# Patient Record
Sex: Male | Born: 1965 | Race: White | Hispanic: No | Marital: Married
Health system: Southern US, Community
[De-identification: ages and names within clinical notes are randomized; demographics above are authoritative.]

---

## 1998-03-14 ENCOUNTER — Ambulatory Visit (HOSPITAL_COMMUNITY): Admission: RE | Admit: 1998-03-14 | Discharge: 1998-03-14 | Payer: Self-pay | Admitting: Family Medicine

## 1998-04-26 ENCOUNTER — Ambulatory Visit (HOSPITAL_BASED_OUTPATIENT_CLINIC_OR_DEPARTMENT_OTHER): Admission: RE | Admit: 1998-04-26 | Discharge: 1998-04-26 | Payer: Self-pay | Admitting: Orthopedic Surgery

## 1998-08-29 ENCOUNTER — Ambulatory Visit (HOSPITAL_COMMUNITY): Admission: RE | Admit: 1998-08-29 | Discharge: 1998-08-29 | Payer: Self-pay | Admitting: Gastroenterology

## 2000-12-04 ENCOUNTER — Encounter: Payer: Self-pay | Admitting: Emergency Medicine

## 2000-12-04 ENCOUNTER — Emergency Department (HOSPITAL_COMMUNITY): Admission: AC | Admit: 2000-12-04 | Discharge: 2000-12-04 | Payer: Self-pay

## 2004-11-10 ENCOUNTER — Inpatient Hospital Stay (HOSPITAL_COMMUNITY): Admission: EM | Admit: 2004-11-10 | Discharge: 2004-11-16 | Payer: Self-pay | Admitting: Emergency Medicine

## 2004-11-10 ENCOUNTER — Ambulatory Visit: Payer: Self-pay | Admitting: Infectious Diseases

## 2006-12-23 IMAGING — CR DG HAND COMPLETE 3+V*R*
3 series · 3 of 3 positions shown · non-contrast
Comparison: none

CLINICAL DATA: 39-year-old male ? right fifth finger swelling which has progressed across the hand.  Possible cellulitis.  No known injury.  
RIGHT HAND ? 3 VIEWS:

[view not recorded (1 of 3)]
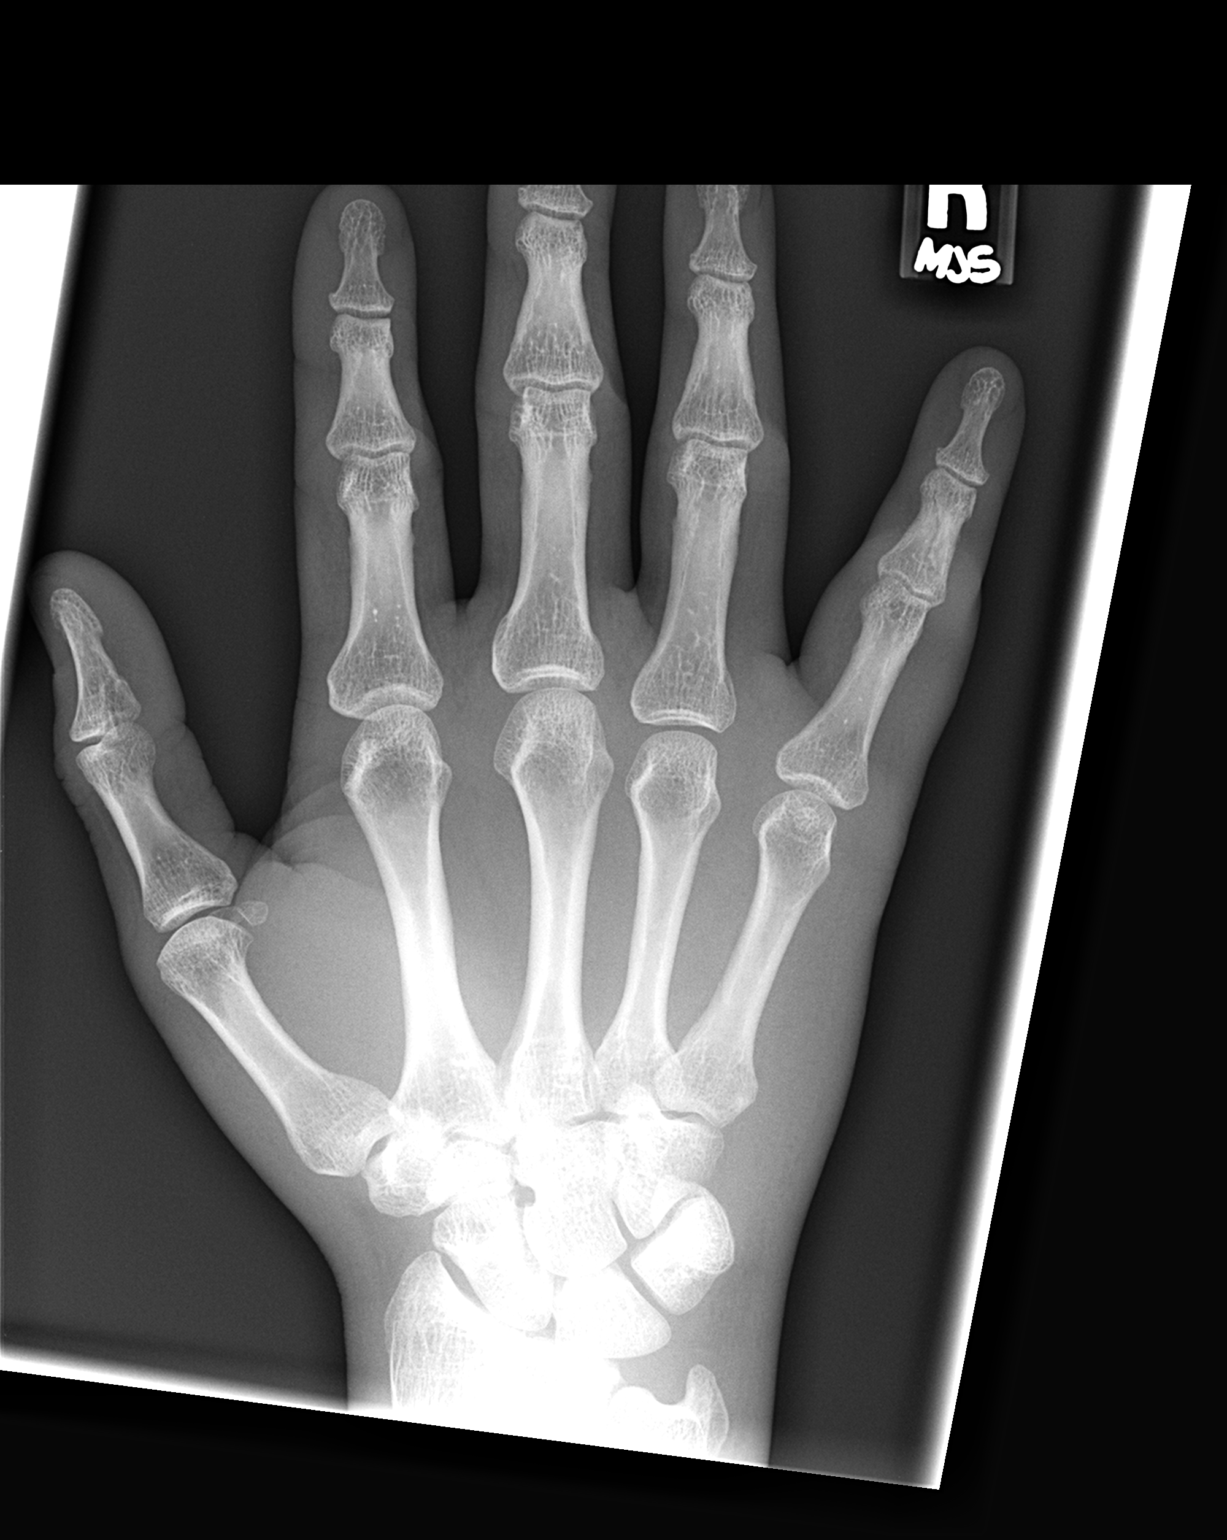

[view not recorded (2 of 3)]
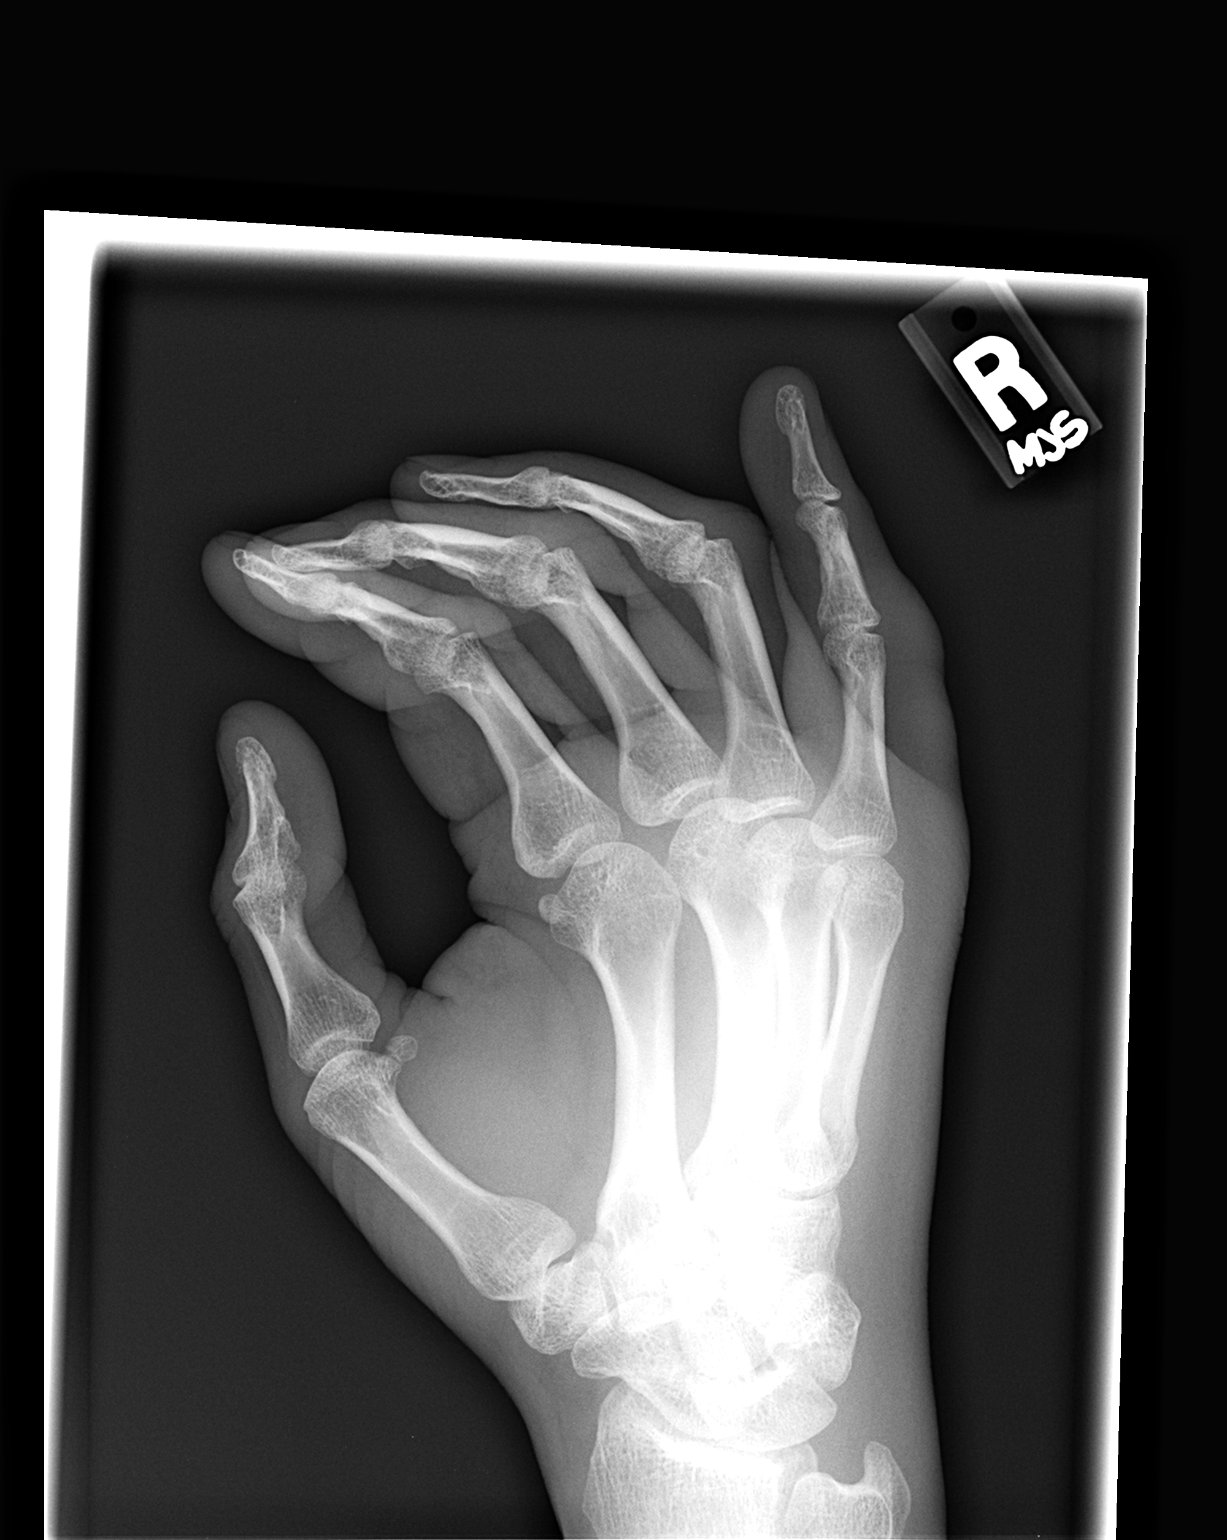

[view not recorded (3 of 3)]
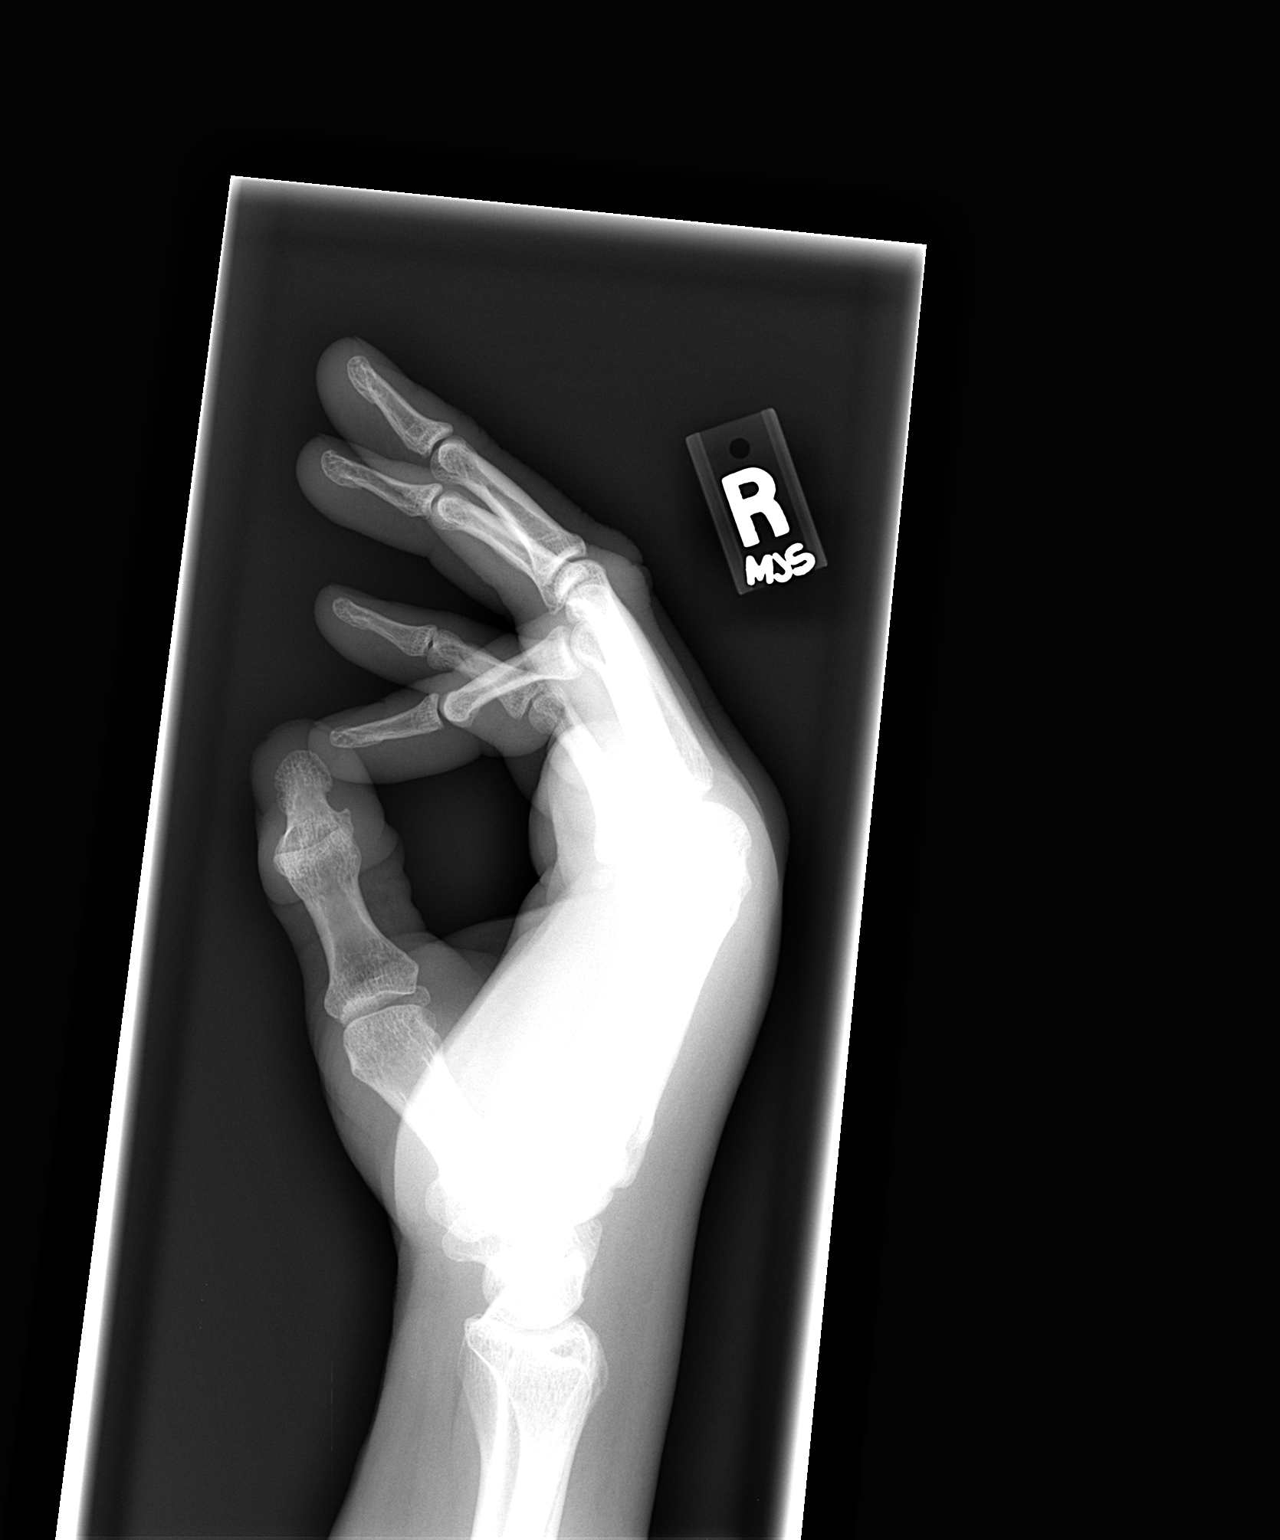

[3 of 3 positions shown; findings below may reference images not displayed]

FINDINGS: Mild soft tissue prominence present of the right fifth finger proximally.  Bone density is normal.  Alignment is anatomic.  No radiopaque foreign body or definite displaced fracture.
IMPRESSION: Soft tissue swelling of the right fifth finger into the dorsum of the hand.  No acute bony finding.

## 2006-12-25 IMAGING — CR DG CHEST 2V
2 series · 2 of 2 positions shown · non-contrast
Comparison: none

CLINICAL DATA: Cellulitis of the hand, for surgery.   Recent fever, spider bite to the hand.  
 CHEST - TWO VIEW:
 PA and lateral views of the chest are made without previous films for comparison show the heart, lungs, bony thorax and soft tissues to be normal.

[w chest pa]
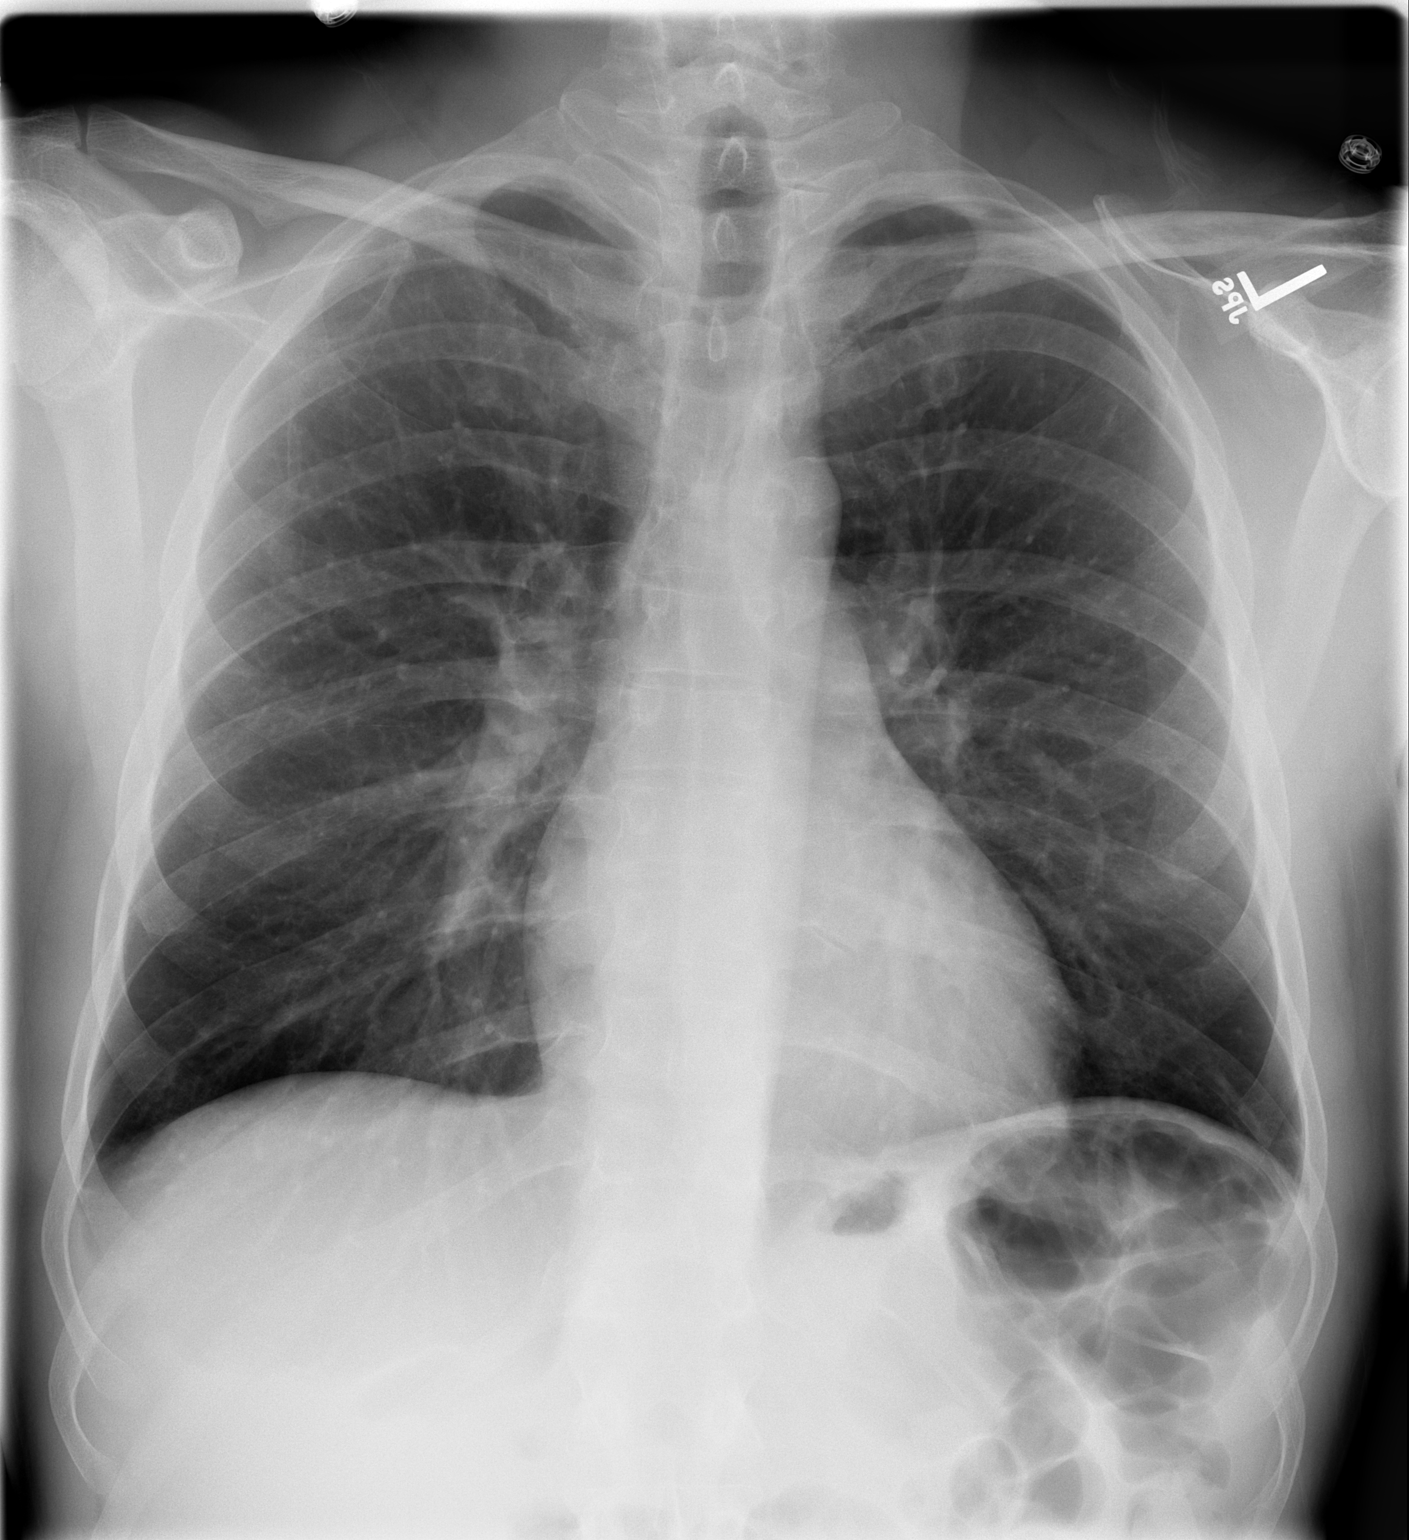

[w chest lat]
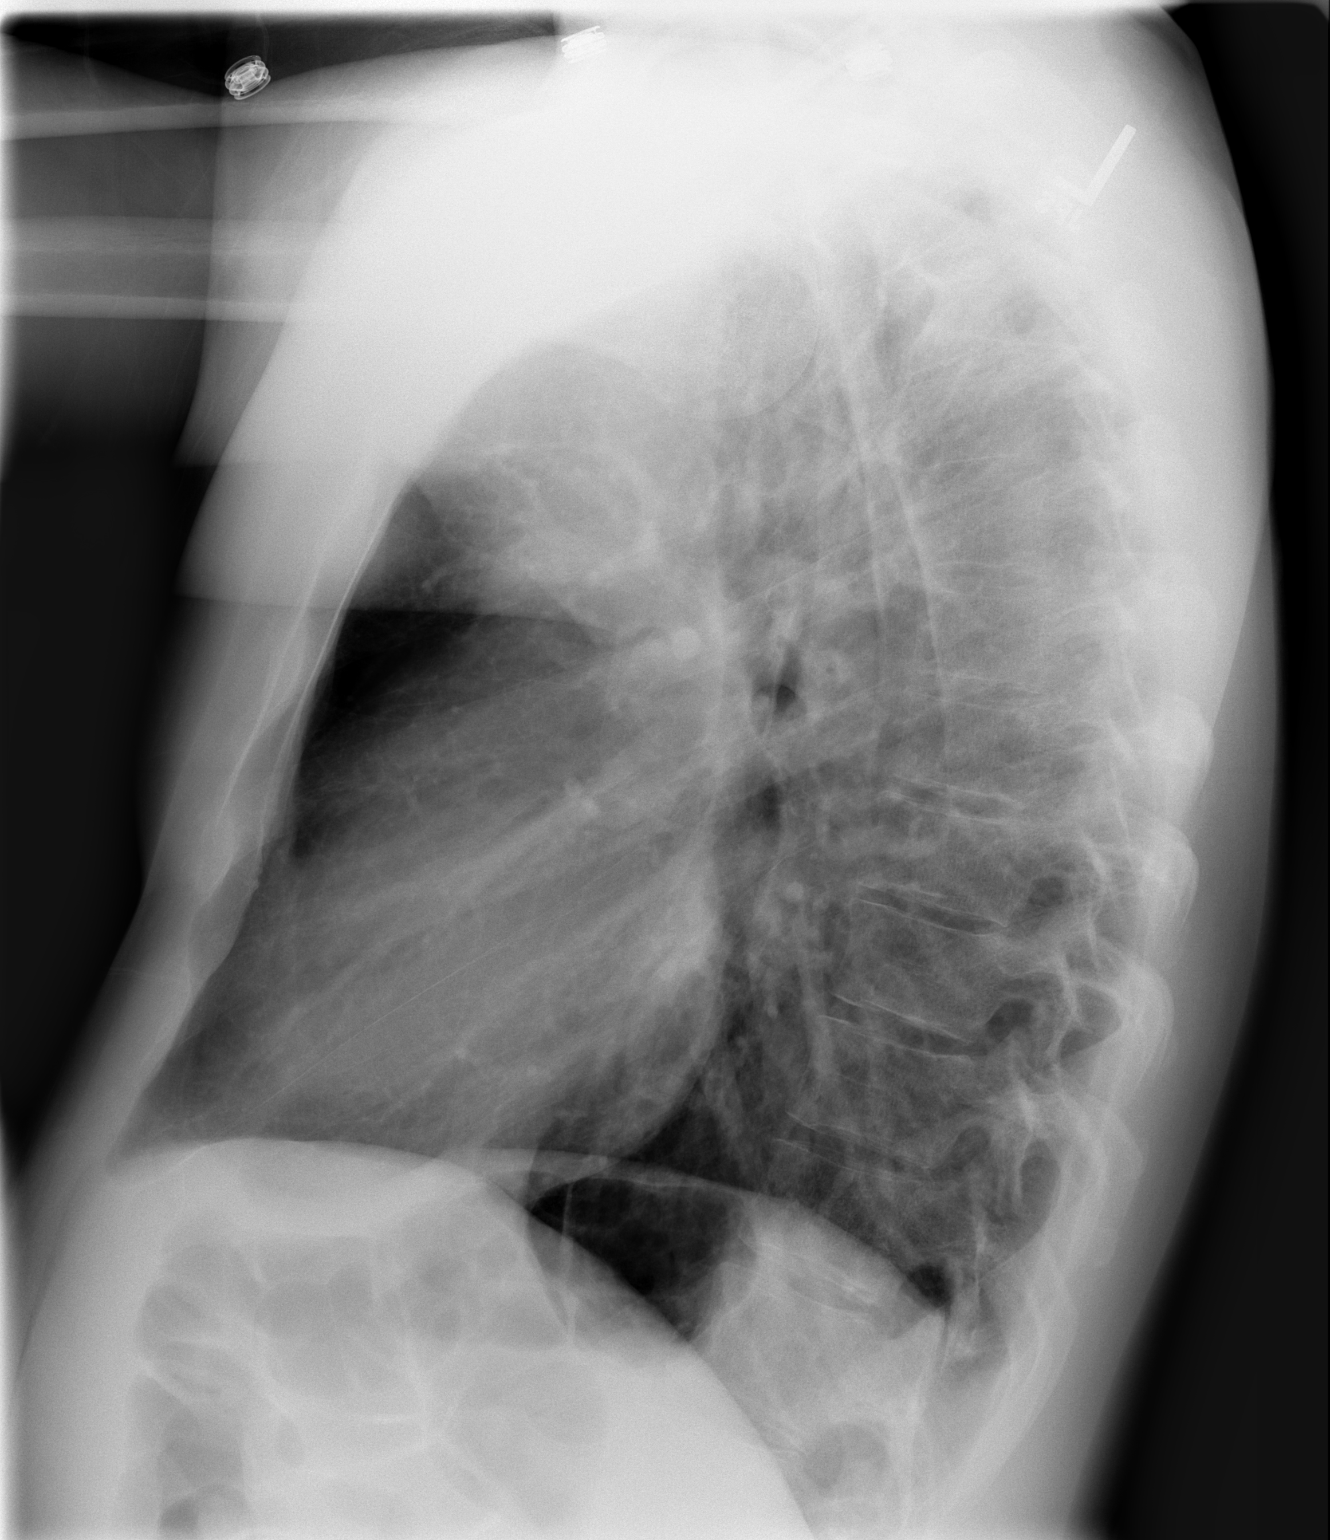

[2 of 2 positions shown; findings below may reference images not displayed]

IMPRESSION: No evidence of active disease in the chest.

## 2006-12-26 IMAGING — CR DG CHEST 1V PORT
1 series · 1 of 1 positions shown · non-contrast
Comparison: 11/12/04.

CLINICAL DATA: Cellulitis of hand after spider bite.  Patient requires PICC line for IV antibiotic therapy and has had a bedside PICC line placed via the left arm. 
 CHEST - 1 VIEW - 11/13/04 AT 6651 HOURS:

[view not recorded]
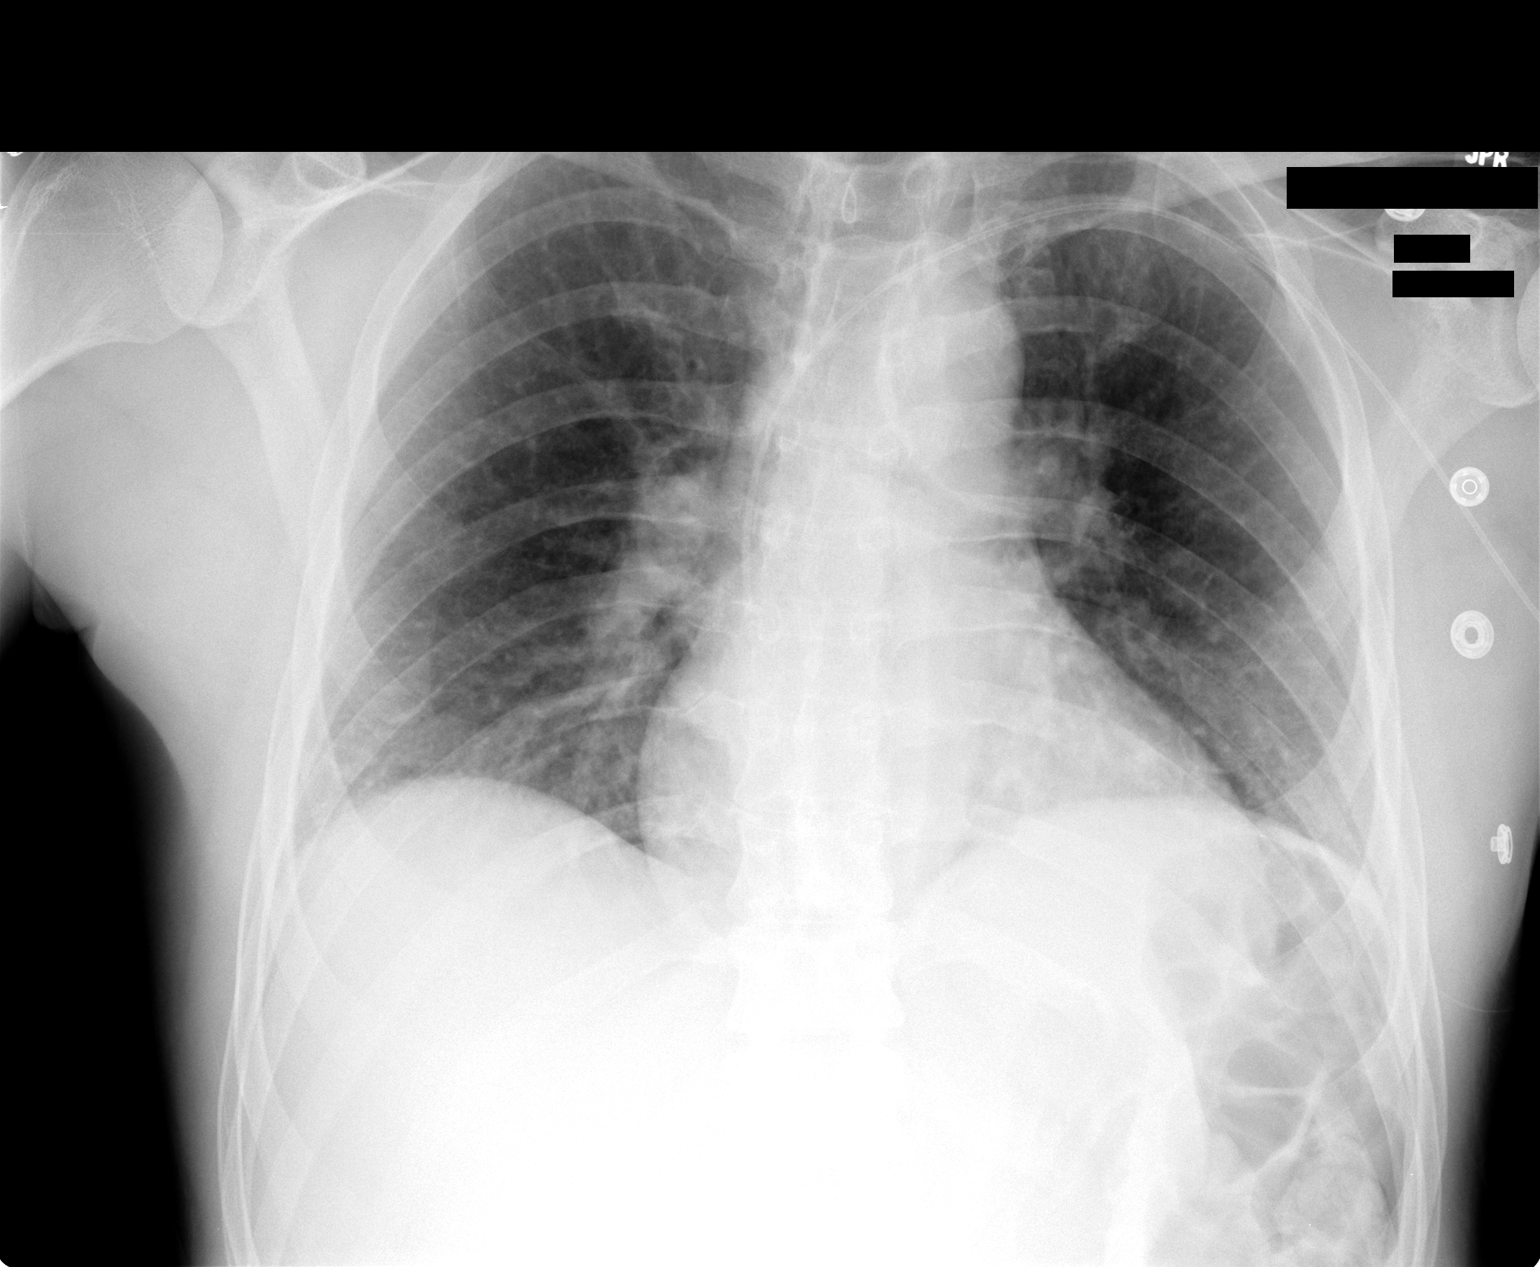

[1 of 1 positions shown; findings below may reference images not displayed]

Left upper extremity PICC line is in place.  The catheter tip lies at the cavoatrial junction.   Lungs show bibasilar atelectasis.
IMPRESSION: PICC line tip lies at the cavoatrial junction.

## 2022-07-07 ENCOUNTER — Other Ambulatory Visit: Payer: Self-pay

## 2022-07-07 ENCOUNTER — Emergency Department (HOSPITAL_COMMUNITY)
Admission: EM | Admit: 2022-07-07 | Discharge: 2022-07-08 | Disposition: A | Discharge status: Home / Self Care | Payer: Commercial Managed Care - HMO | Attending: Emergency Medicine | Admitting: Emergency Medicine

## 2022-07-07 DIAGNOSIS — S41112A Laceration without foreign body of left upper arm, initial encounter: Secondary | ICD-10-CM | POA: Diagnosis not present

## 2022-07-07 DIAGNOSIS — Z23 Encounter for immunization: Secondary | ICD-10-CM | POA: Insufficient documentation

## 2022-07-07 DIAGNOSIS — W540XXA Bitten by dog, initial encounter: Secondary | ICD-10-CM | POA: Diagnosis not present

## 2022-07-07 MED ORDER — AMOXICILLIN-POT CLAVULANATE 875-125 MG PO TABS
1.0000 | ORAL_TABLET | Freq: Once | ORAL | Status: AC
  Administered 2022-07-07: 1 via ORAL
  Filled 2022-07-07: qty 1

## 2022-07-07 MED ORDER — TETANUS-DIPHTH-ACELL PERTUSSIS 5-2.5-18.5 LF-MCG/0.5 IM SUSY
0.5000 mL | PREFILLED_SYRINGE | Freq: Once | INTRAMUSCULAR | Status: AC
  Administered 2022-07-07: 0.5 mL via INTRAMUSCULAR
  Filled 2022-07-07: qty 0.5

## 2022-07-07 MED ORDER — IBUPROFEN 200 MG PO TABS
600.0000 mg | ORAL_TABLET | Freq: Once | ORAL | Status: AC
  Administered 2022-07-07: 600 mg via ORAL
  Filled 2022-07-07: qty 3

## 2022-07-07 NOTE — ED Triage Notes (Signed)
Patient coming to ED for evaluation of dog bite to L arm.  Reports bite was from his personal dog.  Dog is UTD on shots.  Pt was attempting to break up dog fight between his two dogs and one of the dogs bit his arm.  Bleeding controlled at this time.  Tetanus is not UTD.

## 2022-07-07 NOTE — ED Provider Triage Note (Signed)
Emergency Medicine Provider Triage Evaluation Note  Jay Lin , a 56 y.o. male  was evaluated in triage.  Pt complains of dog bite.  Patient states that he was separating his do dogs at home who were fighting and got subsequently bit by one of his dogs.  Bleeding controlled with direct pressure.  Patient unaware of his last tetanus but states that is probably more than 10 years ago..  Review of Systems  Positive: See above Negative:   Physical Exam  BP (!) 215/142   Pulse (!) 115   Temp 98.4 F (36.9 C) (Oral)   Resp 19   Ht 6\' 3"  (1.905 m)   Wt 100.2 kg   SpO2 100%   BMI 27.62 kg/m  Gen:   Awake, no distress   Resp:  Normal effort  MSK:   Moves extremities without difficulty  Other:         Medical Decision Making  Medically screening exam initiated at 10:33 PM.  Appropriate orders placed.  Jay Lin was informed that the remainder of the evaluation will be completed by another provider, this initial triage assessment does not replace that evaluation, and the importance of remaining in the ED until their evaluation is complete.     Jay Lin, Jay Lin 07/07/22 2234

## 2022-07-08 ENCOUNTER — Emergency Department (HOSPITAL_COMMUNITY): Payer: Commercial Managed Care - HMO

## 2022-07-08 MED ORDER — AMOXICILLIN-POT CLAVULANATE 875-125 MG PO TABS: 1.0000 | ORAL_TABLET | Freq: Two times a day (BID) | ORAL | 0 refills | Status: AC

## 2022-07-08 MED ORDER — BACITRACIN ZINC 500 UNIT/GM EX OINT
TOPICAL_OINTMENT | Freq: Two times a day (BID) | CUTANEOUS | Status: DC
  Administered 2022-07-08: 1 via TOPICAL
  Filled 2022-07-08: qty 0.9

## 2022-07-08 MED ORDER — LIDOCAINE-EPINEPHRINE (PF) 2 %-1:200000 IJ SOLN
10.0000 mL | Freq: Once | INTRAMUSCULAR | Status: AC
  Administered 2022-07-08: 10 mL via INTRADERMAL
  Filled 2022-07-08: qty 20

## 2022-07-08 MED ORDER — BACITRACIN ZINC 500 UNIT/GM EX OINT: TOPICAL_OINTMENT | Freq: Two times a day (BID) | CUTANEOUS | Status: DC

## 2022-07-08 MED ORDER — AMLODIPINE BESYLATE 2.5 MG PO TABS: 2.5000 mg | ORAL_TABLET | Freq: Every day | ORAL | 0 refills | Status: AC

## 2022-07-08 MED ORDER — OXYCODONE-ACETAMINOPHEN 5-325 MG PO TABS
1.0000 | ORAL_TABLET | Freq: Once | ORAL | Status: AC
  Administered 2022-07-08: 1 via ORAL
  Filled 2022-07-08: qty 1

## 2022-07-08 NOTE — ED Provider Notes (Signed)
[facility]-EMERGENCY DEPT Provider Note   CSN: [id number] Arrival date & time: 07/07/22  2135     History  Chief Complaint  Patient presents with   Animal Bite    Jay Lin is a 56 y.o. male who presents with concern for dog bite to left arm.  Patient states that he was in his own home and his 2 personal dogs began to fight.  States when he pulled 1 off of the other she turned and bit his upper arm.  She is up-to-date on her rabies vaccination.  He is not up-to-date on his tetanus vaccine.  Bleeding controlled.  Pain recurrent after wearing off of ibuprofen and that was administered 5 hours prior to my evaluation.  Patient also administered Augmentin and Boostrix from triage.  I personally reviewed his medical records.  He does not carry medical diagnoses and is not any medications daily.    HPI     Home Medications Prior to Admission medications   Medication Sig Start Date End Date Taking? Authorizing Provider  amLODipine (NORVASC) 2.5 MG tablet Take 1 tablet (2.5 mg total) by mouth daily. 07/08/22  Yes Ann-Marie Kluge R, PA-C  amoxicillin-clavulanate (AUGMENTIN) 875-125 MG tablet Take 1 tablet by mouth every 12 (twelve) hours for 5 days. 07/08/22 07/13/22 Yes Lila Lufkin, Eugene Gavia, PA-C      Allergies    Patient has no known allergies.    Review of Systems   Review of Systems  Skin:  Positive for wound.    Physical Exam Updated Vital Signs BP (!) 189/129 (BP Location: Right Arm)   Pulse (!) 115   Temp 98.4 F (36.9 C) (Oral)   Resp 19   Ht 6\' 3"  (1.905 m)   Wt 100.2 kg   SpO2 100%   BMI 27.62 kg/m  Physical Exam Vitals and nursing note reviewed.  Constitutional:      Appearance: He is not ill-appearing or toxic-appearing.  HENT:     Head: Normocephalic and atraumatic.  Eyes:     General: No scleral icterus.       Right eye: No discharge.        Left eye: No discharge.     Conjunctiva/sclera: Conjunctivae normal.   Cardiovascular:     Rate and Rhythm: Tachycardia present.     Heart sounds: Normal heart sounds. No murmur heard.    Comments: Tachycardia likely secondary to pain at time of physical exam, HR 118 Pulmonary:     Effort: Pulmonary effort is normal.  Musculoskeletal:       Arms:  Skin:    General: Skin is warm and dry.     Findings: Laceration present.  Neurological:     General: No focal deficit present.     Mental Status: He is alert.  Psychiatric:        Mood and Affect: Mood normal.     ED Results / Procedures / Treatments   Labs (all labs ordered are listed, but only abnormal results are displayed) Labs Reviewed - No data to display  EKG None  Radiology DG Humerus Left  Result Date: 07/08/2022 CLINICAL DATA:  Recent dog bite with possible retained foreign body, initial encounter EXAM: LEFT HUMERUS - 2+ VIEW COMPARISON:  None Available. FINDINGS: No acute fracture or dislocation is noted. No radiopaque foreign body is seen. IMPRESSION: No evidence of radiopaque foreign body. Electronically Signed   By: 14/05/2022 M.D.   On: 07/08/2022 03:59    Procedures .14/11/2023Laceration Repair  Date/Time: 07/08/2022 4:37 AM  Performed by: Paris Lore, PA-C Authorized by: Paris Lore, PA-C   Consent:    Consent obtained:  Verbal   Consent given by:  Patient and parent   Risks discussed:  Infection, need for additional repair, poor wound healing, poor cosmetic result, retained foreign body and pain   Alternatives discussed:  Delayed treatment and no treatment Universal protocol:    Procedure explained and questions answered to patient or proxy's satisfaction: yes     Relevant documents present and verified: yes     Test results available: yes     Imaging studies available: yes     Immediately prior to procedure, a time out was called: yes     Patient identity confirmed:  Verbally with patient Anesthesia:    Anesthesia method:  Local infiltration   Local  anesthetic:  Lidocaine 2% WITH epi Laceration details:    Location:  Shoulder/arm   Shoulder/arm location:  L upper arm   Length (cm):  6 Pre-procedure details:    Preparation:  Patient was prepped and draped in usual sterile fashion and imaging obtained to evaluate for foreign bodies Exploration:    Limited defect created (wound extended): no     Imaging obtained: x-ray     Imaging outcome: foreign body not noted   Treatment:    Area cleansed with:  Saline and Shur-Clens   Amount of cleaning:  Standard   Irrigation solution:  Sterile saline   Irrigation method:  Syringe Skin repair:    Repair method:  Sutures   Suture size:  4-0   Suture material:  Prolene   Suture technique:  Simple interrupted   Number of sutures:  6 Approximation:    Approximation:  Loose Repair type:    Repair type:  Intermediate Post-procedure details:    Dressing:  Antibiotic ointment   Procedure completion:  Tolerated well, no immediate complications     Medications Ordered in ED Medications  bacitracin ointment (has no administration in time range)  Tdap (BOOSTRIX) injection 0.5 mL (0.5 mLs Intramuscular Given 07/07/22 2354)  amoxicillin-clavulanate (AUGMENTIN) 875-125 MG per tablet 1 tablet (1 tablet Oral Given 07/07/22 2353)  ibuprofen (ADVIL) tablet 600 mg (600 mg Oral Given 07/07/22 2353)  oxyCODONE-acetaminophen (PERCOCET/ROXICET) 5-325 MG per tablet 1 tablet (1 tablet Oral Given 07/08/22 0410)  lidocaine-EPINEPHrine (XYLOCAINE W/EPI) 2 %-1:200000 (PF) injection 10 mL (10 mLs Intradermal Given 07/08/22 0410)    ED Course/ Medical Decision Making/ A&P                           Medical Decision Making 56 year old male with dog bite to left upper arm.   HTN and tachycardic on intake, likely secondary to pain. Lacerations to the left upper arm as above.   Amount and/or Complexity of Data Reviewed Radiology: ordered.    Details: Xray negative for retained FB or osseous injury  Risk OTC  drugs. Prescription drug management.   Wound repaired as above; puncture wound left open, however laceration to upper arm too extensive and gaping to leave open. It was loosely approximated.. Patient tolerated procedure well.  In regards to patient's exquisitely high BP on intake and recheck: patient is asymptomatic, no CP, HA, swelling, or changes in urinary habits. He states he has been working out and making lifestyle modifications over the last month in an effort to improve BP after random BP recheck at a pharmacy that was ~250/140s.  States his Bps are usually 170s/100s at home lately after 1 month of lifestyle modification.   Extensive discussion with the patient regarding role of BP control and PCP reevaluation ( has not seen a provider in >10 years). After discussion will start on low dose amlodipine from the ED and recommend close follow up. Will also need follow up for suture removal. Laceration care and return precautions given.   Rana  voiced understanding of his medical evaluation and treatment plan. Each of their questions answered to their expressed satisfaction.  Return precautions were given.  Patient is well-appearing, stable, and was discharged in good condition.  This chart was dictated using voice recognition software, Dragon. Despite the best efforts of this provider to proofread and correct errors, errors may still occur which can change documentation meaning.      Final Clinical Impression(s) / ED Diagnoses Final diagnoses:  Dog bite, initial encounter    Rx / DC Orders ED Discharge Orders          Ordered    amoxicillin-clavulanate (AUGMENTIN) 875-125 MG tablet  Every 12 hours        07/08/22 0349    amLODipine (NORVASC) 2.5 MG tablet  Daily        07/08/22 0435              [place]vine Glorioso, Eugene Gavia, PA-C 07/08/22 0442    Palumbo, April, MD 07/08/22 [phone]

## 2022-07-08 NOTE — Discharge Instructions (Addendum)
You were seen in the ER today for your dog bite.  You may take Tylenol and ibuprofen as needed for your symptoms.  Please take the prescribed antibiotic for the entire course.  Keep the wounds clean and dry and covered.  Follow-up with your primary care doctor return to the ER for any redness, swelling, puslike drainage from the areas, or any other new severe symptom. Your sutures will need removed in 7 -10 days by your PCP, urgent care, or the emergency department.  You may also use the prescribed blood pressure medication daily; log your blood pressures in a journal and follow up as soon as possible for recheck and further management of your BP.
